# Patient Record
Sex: Female | Born: 1970 | Race: White | Hispanic: No | Marital: Married | Smoking: Never smoker
Health system: Southern US, Community
[De-identification: ages and names within clinical notes are randomized; demographics above are authoritative.]

## PROBLEM LIST (undated history)

## (undated) DIAGNOSIS — E079 Disorder of thyroid, unspecified: Secondary | ICD-10-CM

---

## 2000-05-27 ENCOUNTER — Other Ambulatory Visit: Admission: RE | Admit: 2000-05-27 | Discharge: 2000-05-27 | Payer: Self-pay | Admitting: Obstetrics and Gynecology

## 2001-06-07 ENCOUNTER — Other Ambulatory Visit: Admission: RE | Admit: 2001-06-07 | Discharge: 2001-06-07 | Payer: Self-pay | Admitting: Gynecology

## 2002-07-19 ENCOUNTER — Other Ambulatory Visit: Admission: RE | Admit: 2002-07-19 | Discharge: 2002-07-19 | Payer: Self-pay | Admitting: Gynecology

## 2003-07-16 ENCOUNTER — Other Ambulatory Visit: Admission: RE | Admit: 2003-07-16 | Discharge: 2003-07-16 | Payer: Self-pay | Admitting: Gynecology

## 2004-06-04 ENCOUNTER — Other Ambulatory Visit: Admission: RE | Admit: 2004-06-04 | Discharge: 2004-06-04 | Payer: Self-pay | Admitting: Obstetrics and Gynecology

## 2005-03-04 ENCOUNTER — Encounter: Admission: RE | Admit: 2005-03-04 | Discharge: 2005-03-04 | Payer: Self-pay | Admitting: Orthopedic Surgery

## 2005-06-07 ENCOUNTER — Other Ambulatory Visit: Admission: RE | Admit: 2005-06-07 | Discharge: 2005-06-07 | Payer: Self-pay | Admitting: Obstetrics and Gynecology

## 2006-06-14 ENCOUNTER — Other Ambulatory Visit: Admission: RE | Admit: 2006-06-14 | Discharge: 2006-06-14 | Payer: Self-pay | Admitting: Obstetrics and Gynecology

## 2007-07-20 ENCOUNTER — Other Ambulatory Visit: Admission: RE | Admit: 2007-07-20 | Discharge: 2007-07-20 | Payer: Self-pay | Admitting: Obstetrics and Gynecology

## 2008-07-23 ENCOUNTER — Other Ambulatory Visit: Admission: RE | Admit: 2008-07-23 | Discharge: 2008-07-23 | Payer: Self-pay | Admitting: Obstetrics and Gynecology

## 2009-06-30 ENCOUNTER — Other Ambulatory Visit: Admission: RE | Admit: 2009-06-30 | Discharge: 2009-06-30 | Payer: Self-pay | Admitting: Obstetrics and Gynecology

## 2014-05-21 ENCOUNTER — Other Ambulatory Visit: Payer: Self-pay

## 2014-05-21 DIAGNOSIS — Z1231 Encounter for screening mammogram for malignant neoplasm of breast: Secondary | ICD-10-CM

## 2014-05-27 ENCOUNTER — Encounter (INDEPENDENT_AMBULATORY_CARE_PROVIDER_SITE_OTHER): Payer: Self-pay

## 2014-05-27 ENCOUNTER — Ambulatory Visit
Admission: RE | Admit: 2014-05-27 | Discharge: 2014-05-27 | Disposition: A | Discharge status: Home / Self Care | Payer: 59 | Source: Ambulatory Visit

## 2014-05-27 DIAGNOSIS — Z1231 Encounter for screening mammogram for malignant neoplasm of breast: Secondary | ICD-10-CM

## 2016-05-31 ENCOUNTER — Other Ambulatory Visit: Payer: Self-pay | Admitting: Obstetrics and Gynecology

## 2016-05-31 DIAGNOSIS — Z1231 Encounter for screening mammogram for malignant neoplasm of breast: Secondary | ICD-10-CM

## 2016-07-09 ENCOUNTER — Ambulatory Visit
Admission: RE | Admit: 2016-07-09 | Discharge: 2016-07-09 | Disposition: A | Discharge status: Home / Self Care | Payer: 59 | Source: Ambulatory Visit | Attending: Obstetrics and Gynecology | Admitting: Obstetrics and Gynecology

## 2016-07-09 DIAGNOSIS — Z1231 Encounter for screening mammogram for malignant neoplasm of breast: Secondary | ICD-10-CM

## 2017-07-13 ENCOUNTER — Other Ambulatory Visit: Payer: Self-pay | Admitting: Obstetrics and Gynecology

## 2017-07-13 DIAGNOSIS — Z1231 Encounter for screening mammogram for malignant neoplasm of breast: Secondary | ICD-10-CM

## 2017-08-09 ENCOUNTER — Ambulatory Visit
Admission: RE | Admit: 2017-08-09 | Discharge: 2017-08-09 | Disposition: A | Discharge status: Home / Self Care | Payer: 59 | Source: Ambulatory Visit | Attending: Obstetrics and Gynecology | Admitting: Obstetrics and Gynecology

## 2017-08-09 DIAGNOSIS — Z1231 Encounter for screening mammogram for malignant neoplasm of breast: Secondary | ICD-10-CM

## 2017-09-10 ENCOUNTER — Other Ambulatory Visit: Payer: Self-pay

## 2017-09-10 ENCOUNTER — Emergency Department (HOSPITAL_BASED_OUTPATIENT_CLINIC_OR_DEPARTMENT_OTHER)
Admission: EM | Admit: 2017-09-10 | Discharge: 2017-09-10 | Disposition: A | Discharge status: Home / Self Care | Payer: 59 | Attending: Emergency Medicine | Admitting: Emergency Medicine

## 2017-09-10 ENCOUNTER — Encounter (HOSPITAL_BASED_OUTPATIENT_CLINIC_OR_DEPARTMENT_OTHER): Payer: Self-pay | Admitting: Emergency Medicine

## 2017-09-10 DIAGNOSIS — R21 Rash and other nonspecific skin eruption: Secondary | ICD-10-CM | POA: Insufficient documentation

## 2017-09-10 DIAGNOSIS — Z79899 Other long term (current) drug therapy: Secondary | ICD-10-CM | POA: Diagnosis not present

## 2017-09-10 DIAGNOSIS — Z7984 Long term (current) use of oral hypoglycemic drugs: Secondary | ICD-10-CM | POA: Diagnosis not present

## 2017-09-10 HISTORY — DX: Disorder of thyroid, unspecified: E07.9

## 2017-09-10 LAB — PREGNANCY, URINE: Preg Test, Ur: NEGATIVE

## 2017-09-10 MED ORDER — DOXYCYCLINE HYCLATE 100 MG PO CAPS: 100.0000 mg | ORAL_CAPSULE | Freq: Two times a day (BID) | ORAL | 0 refills | Status: AC

## 2017-09-10 NOTE — ED Triage Notes (Signed)
Small red itchy bumps over entire body. States it started on chest and arms Wednesday and woke up this morning and it was on entire body.

## 2017-09-10 NOTE — ED Provider Notes (Signed)
[facility] EMERGENCY DEPARTMENT Provider Note   CSN: [id number] Arrival date & time: 09/10/17  4098     History   Chief Complaint Chief Complaint  Patient presents with  . Rash    HPI Angela Haley is a 47 y.o. female personal history of thyroid disease who presents for evaluation of generalized rash x3 days.  Patient reports that initially, the rash started on her chest and arms.  Patient reports that she saw her primary care doctor yesterday for evaluation of rash and was told to take Benadryl and Zyrtec which she states she has been taking.  Patient reports that last night, the rash became worse and spread to her arms, back, chest, abdomen, legs.  Patient reports that the rash is pruritic but is not painful.  States that the Benadryl helps some with the symptoms but states that did not help with the rash.  Patient denies any new exposures, lotions, detergents, soaps.  She denies any new antibiotics or other new medications.  She has no known history of allergies and states she has not eaten any abnormal foods.  Patient reports that she has not known of any tick bites but states she has been out side walking.  Additionally, she does have animals.  Patient states that she was recently in [place] but had no encounters with any animals there.  Additionally, patient reports that prior to onset of symptoms, she did swim in a public pool but she states that she has done so before without any abnormalities.  Patient denies any fevers, chest pain, difficulty breathing, nausea/vomiting.  The history is provided by the patient.    Past Medical History:  Diagnosis Date  . Thyroid disease     There are no active problems to display for this patient.   History reviewed. No pertinent surgical history.   OB History   None      Home Medications    Prior to Admission medications   Medication Sig Start Date End Date Taking? Authorizing Provider  levothyroxine (SYNTHROID) 200  MCG tablet daily 04/09/15  Yes [provider]  metFORMIN (GLUCOPHAGE-XR) 500 MG 24 hr tablet TK 1 TS PO once daily 04/02/15  Yes [provider]  norethindrone-ethinyl estradiol (JUNEL FE,GILDESS FE,LOESTRIN FE) 1-20 MG-MCG tablet Take by mouth. 07/22/17  Yes [provider]  doxycycline (VIBRAMYCIN) 100 MG capsule Take 1 capsule (100 mg total) by mouth 2 (two) times daily for 10 days. 09/10/17 09/20/17  Maxwell Caul, PA-C  escitalopram (LEXAPRO) 10 MG tablet  09/05/17   [provider]  hydrochlorothiazide (HYDRODIURIL) 25 MG tablet  09/04/17   [provider]  VENTOLIN HFA 108 (90 Base) MCG/ACT inhaler INHALE 2 PUFFS EVEY 4 HOURS PRN WHEEZING 06/14/17   [provider]    Family History Family History  Problem Relation Age of Onset  . Breast cancer Neg Hx     Social History Social History   Tobacco Use  . Smoking status: Never Smoker  . Smokeless tobacco: Never Used  Substance Use Topics  . Alcohol use: Yes  . Drug use: Never     Allergies   Codeine   Review of Systems Review of Systems  Constitutional: Negative for fever.  Respiratory: Negative for cough and shortness of breath.   Cardiovascular: Negative for chest pain.  Gastrointestinal: Negative for abdominal pain, nausea and vomiting.  Skin: Positive for rash. Negative for wound.  Neurological: Negative for headaches.  All other systems reviewed and are  negative.    Physical Exam Updated Vital Signs BP 119/69 (BP Location: Right Arm)   Pulse 64   Temp 98.4 F (36.9 C) (Oral)   Resp 18   Ht 5\' 6"  (1.676 m)   Wt 119.7 kg (264 lb)   SpO2 97%   BMI 42.61 kg/m   Physical Exam  Constitutional: She appears well-developed and well-nourished.  HENT:  Head: Normocephalic and atraumatic.  Mouth/Throat: Oropharynx is clear and moist and mucous membranes are normal. No oral lesions.  Airway is patent, phonation is normal.  No oral lesions.  Eyes: Conjunctivae  and EOM are normal. Right eye exhibits no discharge. Left eye exhibits no discharge. No scleral icterus.  Pulmonary/Chest: Effort normal.  Neurological: She is alert.  Skin: Skin is warm and dry. Capillary refill takes less than 2 seconds. Rash noted. Rash is papular.  Diffusely scattered papular rash noted to arms, legs, abdomen, chest, back, face.  No rash noted on palms or soles.  No purpura or petechiae noted.  Psychiatric: She has a normal mood and affect. Her speech is normal and behavior is normal.  Nursing note and vitals reviewed.          ED Treatments / Results  Labs (all labs ordered are listed, but only abnormal results are displayed) Labs Reviewed  PREGNANCY, URINE  ROCKY MTN SPOTTED FVR ABS PNL(IGG+IGM)  B. BURGDORFI ANTIBODIES  LYME DISEASE, WESTERN BLOT    EKG None  Radiology No results found.  Procedures Procedures (including critical care time)  Medications Ordered in ED Medications - No data to display   Initial Impression / Assessment and Plan / ED Course  I have reviewed the triage vital signs and the nursing notes.  Pertinent labs & imaging results that were available during my care of the patient were reviewed by me and considered in my medical decision making (see chart for details).     47 year old female who presents for evaluation of rash x2 days.  Reports that initially was on her arms and chest.  States it spread last night.  No associated fevers, chills, vomiting, dumping, chest pain, difficulty breathing.  No new exposures, soaps, detergents.  Patient states that she has not noticed any tick bite but states she has been outside.  She also has animals.  Patient is afebrile, non-toxic appearing, sitting comfortably on examination table. Vital signs reviewed and stable.  On exam, patient has a diffuse papular rash noted throughout her entire body but spares the palms and soles.  No evidence of oral lesions. Discussed patient with Dr. Rush Landmark.   While the rash spares the palms and soles of feet, it is concerning given its papular nature for [facility] spotted fever.  At this time, the rash does not appear to be syphilis, SJS, TENS.  Additionally, does not appear to be a contact dermatitis, eczema.  We discussed at length with patient regarding concerns for unknown rash, particularly the risks of leaving recommended spotted fever untreated.  At this time,  given concerning generalized rash, will plan to go ahead and prophylactically treat for RMSF.  Patient would like to have the titers drawn.   Urine pregnancy negative.  Titers drawn.  We will plan to start patient on doxycycline.  Encourage primary care follow-up in the next 2 to 4 days for further evaluation.  Instructed patient to return the emergency department for any worsening or concerning symptoms. Patient had ample opportunity for questions and discussion. All patient's questions were answered with full  understanding. Strict return precautions discussed. Patient expresses understanding and agreement to plan.    Final Clinical Impressions(s) / ED Diagnoses   Final diagnoses:  Rash    ED Discharge Orders        Ordered    doxycycline (VIBRAMYCIN) 100 MG capsule  2 times daily     09/10/17 1145       Rosana HoesLayden, [place] A, PA-C 09/10/17 1211    Tegeler, Canary Brimhristopher J, MD 09/10/17 [phone]

## 2017-09-10 NOTE — Discharge Instructions (Signed)
As we discussed, today [facility].  The titers that we do do not come back for several days.  You can log it online to see if this titers have returned.  Take antibiotic as directed.  As we discussed, this antibiotic can cause a photosensitivity rash.  Please be aware that if you are out in the sun, you need to be covered.  Follow-up with your primary care doctor in the next 2 to 4 days for further evaluation.  Return the emergency department immediately for any worsening rash, rash or lesions in your mouth, fevers, vomiting, difficulty breathing, chest pain or any other worsening or concerning symptoms.

## 2017-09-12 LAB — B. BURGDORFI ANTIBODIES: B burgdorferi Ab IgG+IgM: <0.91 {ISR} (ref 0.00–0.90)

## 2017-09-13 LAB — LYME DISEASE, WESTERN BLOT
IGG P28 AB.: ABSENT
IGG P30 AB.: ABSENT
IGG P45 AB.: ABSENT
IGG P58 AB.: ABSENT
IGM P23 AB.: ABSENT
IgG P18 Ab.: ABSENT
IgG P23 Ab.: ABSENT
IgG P39 Ab.: ABSENT
IgG P66 Ab.: ABSENT
IgG P93 Ab.: ABSENT
IgM P39 Ab.: ABSENT
IgM P41 Ab.: ABSENT
LYME IGM WB: NEGATIVE
Lyme IgG Wb: NEGATIVE

## 2017-09-13 LAB — ROCKY MTN SPOTTED FVR ABS PNL(IGG+IGM)
RMSF IGG: NEGATIVE
RMSF IgM: 0.18 index (ref 0.00–0.89)

## 2018-01-26 ENCOUNTER — Other Ambulatory Visit: Payer: Self-pay | Admitting: Oncology

## 2018-11-01 ENCOUNTER — Other Ambulatory Visit: Payer: Self-pay | Admitting: Emergency Medicine

## 2018-11-01 DIAGNOSIS — Z1231 Encounter for screening mammogram for malignant neoplasm of breast: Secondary | ICD-10-CM

## 2018-12-14 ENCOUNTER — Other Ambulatory Visit: Payer: Self-pay

## 2018-12-14 ENCOUNTER — Ambulatory Visit
Admission: RE | Admit: 2018-12-14 | Discharge: 2018-12-14 | Disposition: A | Discharge status: Home / Self Care | Payer: 59 | Source: Ambulatory Visit | Attending: Emergency Medicine | Admitting: Emergency Medicine

## 2018-12-14 DIAGNOSIS — Z1231 Encounter for screening mammogram for malignant neoplasm of breast: Secondary | ICD-10-CM

## 2019-02-28 ENCOUNTER — Other Ambulatory Visit: Payer: Self-pay | Admitting: Surgical Oncology

## 2019-03-14 ENCOUNTER — Ambulatory Visit
Admission: RE | Admit: 2019-03-14 | Discharge: 2019-03-14 | Disposition: A | Discharge status: Home / Self Care | Payer: 59 | Source: Ambulatory Visit | Attending: Surgical Oncology | Admitting: Surgical Oncology

## 2019-06-01 ENCOUNTER — Ambulatory Visit: Payer: 59 | Attending: Internal Medicine

## 2019-06-01 DIAGNOSIS — Z23 Encounter for immunization: Secondary | ICD-10-CM

## 2019-06-01 NOTE — Progress Notes (Signed)
   Covid-19 Vaccination Clinic  Name:  Angela Haley    MRN: [id number] DOB: [birth year]  06/01/2019  [name] was observed post Covid-19 immunization for 15 minutes without incident. She was provided with Vaccine Information Sheet and instruction to access the V-Safe system.   [name] was instructed to call 911 with any severe reactions post vaccine: Marland Kitchen Difficulty breathing  . Swelling of face and throat  . A fast heartbeat  . A bad rash all over body  . Dizziness and weakness   Immunizations Administered    Name Date Dose VIS Date Route   Pfizer COVID-19 Vaccine 06/01/2019  4:20 PM 0.3 mL 02/23/2019 Intramuscular   Manufacturer: [employer], [employer]   Lot: [id number]   NDC: [address]-9

## 2019-06-27 ENCOUNTER — Ambulatory Visit: Payer: 59 | Attending: Internal Medicine

## 2019-06-27 DIAGNOSIS — Z23 Encounter for immunization: Secondary | ICD-10-CM

## 2019-06-27 NOTE — Progress Notes (Signed)
   Covid-19 Vaccination Clinic  Name:  Angela Haley    MRN: [id number] DOB: [birth year]  06/27/2019  Ms. Angela Haley was observed post Covid-19 immunization for 15 minutes without incident. She was provided with Vaccine Information Sheet and instruction to access the V-Safe system.   Ms. Angela Haley was instructed to call 911 with any severe reactions post vaccine: Marland Kitchen Difficulty breathing  . Swelling of face and throat  . A fast heartbeat  . A bad rash all over body  . Dizziness and weakness   Immunizations Administered    Name Date Dose VIS Date Route   Pfizer COVID-19 Vaccine 06/27/2019  4:17 PM 0.3 mL 02/23/2019 Intramuscular   Manufacturer: [employer], [employer]   Lot: [id number]   NDC: [address]-8

## 2020-01-30 ENCOUNTER — Other Ambulatory Visit: Payer: Self-pay | Admitting: Emergency Medicine

## 2020-01-30 DIAGNOSIS — N6320 Unspecified lump in the left breast, unspecified quadrant: Secondary | ICD-10-CM

## 2020-02-14 ENCOUNTER — Ambulatory Visit
Admission: RE | Admit: 2020-02-14 | Discharge: 2020-02-14 | Disposition: A | Discharge status: Home / Self Care | Payer: 59 | Source: Ambulatory Visit | Attending: Emergency Medicine | Admitting: Emergency Medicine

## 2020-02-14 ENCOUNTER — Other Ambulatory Visit: Payer: Self-pay

## 2020-02-14 DIAGNOSIS — N6320 Unspecified lump in the left breast, unspecified quadrant: Secondary | ICD-10-CM

## 2020-03-03 ENCOUNTER — Other Ambulatory Visit: Payer: 59

## 2021-01-28 ENCOUNTER — Other Ambulatory Visit: Payer: Self-pay | Admitting: Emergency Medicine

## 2021-01-28 DIAGNOSIS — Z1231 Encounter for screening mammogram for malignant neoplasm of breast: Secondary | ICD-10-CM

## 2021-02-17 ENCOUNTER — Other Ambulatory Visit: Payer: Self-pay | Admitting: Obstetrics & Gynecology

## 2021-02-17 DIAGNOSIS — Z1231 Encounter for screening mammogram for malignant neoplasm of breast: Secondary | ICD-10-CM

## 2021-03-03 ENCOUNTER — Ambulatory Visit: Payer: 59

## 2021-04-08 ENCOUNTER — Ambulatory Visit: Payer: 59

## 2021-04-29 ENCOUNTER — Ambulatory Visit
Admission: RE | Admit: 2021-04-29 | Discharge: 2021-04-29 | Disposition: A | Discharge status: Home / Self Care | Payer: 59 | Source: Ambulatory Visit | Attending: Obstetrics & Gynecology | Admitting: Obstetrics & Gynecology

## 2021-04-29 DIAGNOSIS — Z1231 Encounter for screening mammogram for malignant neoplasm of breast: Secondary | ICD-10-CM

## 2023-01-12 ENCOUNTER — Other Ambulatory Visit: Payer: Self-pay | Admitting: Obstetrics & Gynecology

## 2023-01-12 DIAGNOSIS — Z1231 Encounter for screening mammogram for malignant neoplasm of breast: Secondary | ICD-10-CM

## 2023-02-16 ENCOUNTER — Ambulatory Visit
Admission: RE | Admit: 2023-02-16 | Discharge: 2023-02-16 | Disposition: A | Discharge status: Home / Self Care | Payer: No Typology Code available for payment source | Source: Ambulatory Visit | Attending: Obstetrics & Gynecology | Admitting: Obstetrics & Gynecology

## 2023-02-16 DIAGNOSIS — Z1231 Encounter for screening mammogram for malignant neoplasm of breast: Secondary | ICD-10-CM

## 2023-08-29 ENCOUNTER — Ambulatory Visit
Admission: RE | Admit: 2023-08-29 | Discharge: 2023-08-29 | Disposition: A | Discharge status: Home / Self Care | Source: Ambulatory Visit | Attending: Family Medicine | Admitting: Family Medicine

## 2023-08-29 VITALS — BP 110/73 | HR 80 | Temp 98.3°F | Resp 16

## 2023-08-29 DIAGNOSIS — B349 Viral infection, unspecified: Secondary | ICD-10-CM

## 2023-08-29 LAB — POCT RAPID STREP A (OFFICE): Rapid Strep A Screen: NEGATIVE

## 2023-08-29 MED ORDER — AZELASTINE HCL 0.1 % NA SOLN: 1.0000 | Freq: Two times a day (BID) | NASAL | 1 refills | Status: AC

## 2023-08-29 MED ORDER — FEXOFENADINE HCL 180 MG PO TABS: 180.0000 mg | ORAL_TABLET | Freq: Every day | ORAL | 1 refills | Status: AC

## 2023-08-29 NOTE — Discharge Instructions (Signed)
  1. Acute viral syndrome (Primary) - POCT rapid strep A completed in UC is negative for strep pharyngitis - azelastine (ASTELIN) 0.1 % nasal spray; Place 1 spray into both nostrils 2 (two) times daily. Use in each nostril as directed  Dispense: 30 mL; Refill: 1 - fexofenadine (ALLEGRA) 180 MG tablet; Take 1 tablet (180 mg total) by mouth daily.  Dispense: 60 tablet; Refill: 1 Continue to monitor symptoms for any changes severity if there is any escalation of current symptoms or development of new symptoms follow-up for further evaluation treatment.

## 2023-08-29 NOTE — ED Triage Notes (Signed)
 Pt c/o sore throat that started Saturday. Next day developed cough and congestion. Sore throat has improved some since Saturday

## 2023-08-29 NOTE — ED Provider Notes (Signed)
 [facility]  Note:  This document was prepared using Dragon voice recognition software and may include unintentional dictation errors.  MRN: [id number] DOB: [birth year]  Subjective:   Angela Haley is a 53 y.o. female presenting for moderately sore throat starting on Saturday.  Patient states that she took Tylenol Mucinex when she woke up on Sunday sore throat was improved however she had developed nasal congestion and a cough.  Patient states today she feels worse than she did yesterday.  Patient denies any known exposure anyone sick with COVID, flu, strep.  Patient reports that she did a COVID test at home which was negative.  Patient has not taken any other medication with Mucinex and Tylenol for symptoms.  No shortness of breath, chest pain, weakness, dizziness.  No current facility-administered medications for this encounter.  Current Outpatient Medications:    azelastine (ASTELIN) 0.1 % nasal spray, Place 1 spray into both nostrils 2 (two) times daily. Use in each nostril as directed, Disp: 30 mL, Rfl: 1   fexofenadine (ALLEGRA) 180 MG tablet, Take 1 tablet (180 mg total) by mouth daily., Disp: 60 tablet, Rfl: 1   Phentermine-Topiramate (QSYMIA PO), Take by mouth., Disp: , Rfl:    escitalopram (LEXAPRO) 10 MG tablet, , Disp: , Rfl: 5   hydrochlorothiazide (HYDRODIURIL) 25 MG tablet, , Disp: , Rfl:    levothyroxine (SYNTHROID) 200 MCG tablet, 150mg  M,W, F, Sun 125mg  T,Th, Sat, Disp: , Rfl:    metFORMIN (GLUCOPHAGE-XR) 500 MG 24 hr tablet, TK 1 TS PO once daily (Patient not taking: Reported on 08/29/2023), Disp: , Rfl:    norethindrone-ethinyl estradiol (JUNEL FE,GILDESS FE,LOESTRIN FE) 1-20 MG-MCG tablet, Take by mouth., Disp: , Rfl:    VENTOLIN HFA 108 (90 Base) MCG/ACT inhaler, INHALE 2 PUFFS EVEY 4 HOURS PRN WHEEZING, Disp: , Rfl: 0   Allergies  Allergen Reactions   Codeine     Past Medical History:  Diagnosis Date   Thyroid disease      History  reviewed. No pertinent surgical history.  Family History  Problem Relation Age of Onset   Breast cancer Neg Hx     Social History   Tobacco Use   Smoking status: Never   Smokeless tobacco: Never  Substance Use Topics   Alcohol use: Yes   Drug use: Never    ROS Refer to HPI for ROS details.  Objective:    Vitals: BP 110/73 (BP Location: Right Arm)   Pulse 80   Temp 98.3 F (36.8 C) (Oral)   Resp 16   SpO2 98%   Physical Exam Vitals and nursing note reviewed.  Constitutional:      General: She is not in acute distress.    Appearance: She is well-developed. She is not ill-appearing or toxic-appearing.  HENT:     Head: Normocephalic and atraumatic.     Mouth/Throat:     Mouth: Mucous membranes are moist.   Cardiovascular:     Rate and Rhythm: Normal rate.  Pulmonary:     Effort: Pulmonary effort is normal. No respiratory distress.   Musculoskeletal:        General: Normal range of motion.   Skin:    General: Skin is warm and dry.   Neurological:     General: No focal deficit present.     Mental Status: She is alert and oriented to person, place, and time.   Psychiatric:        Mood and Affect: Mood normal.  Behavior: Behavior normal.     Procedures  Results for orders placed or performed during the hospital encounter of 08/29/23 (from the past 24 hours)  POCT rapid strep A     Status: None   Collection Time: 08/29/23  5:41 PM  Result Value Ref Range   Rapid Strep A Screen Negative Negative    Assessment and Plan :     Discharge Instructions       1. Acute viral syndrome (Primary) - POCT rapid strep A completed in UC is negative for strep pharyngitis - azelastine (ASTELIN) 0.1 % nasal spray; Place 1 spray into both nostrils 2 (two) times daily. Use in each nostril as directed  Dispense: 30 mL; Refill: 1 - fexofenadine (ALLEGRA) 180 MG tablet; Take 1 tablet (180 mg total) by mouth daily.  Dispense: 60 tablet; Refill: 1 Continue to  monitor symptoms for any changes severity if there is any escalation of current symptoms or development of new symptoms follow-up for further evaluation treatment.      Jackey Housey B Chiante Peden   Tyrice Hewitt, [place], [place] 08/29/23 1825

## 2023-09-01 ENCOUNTER — Other Ambulatory Visit: Payer: Self-pay

## 2023-09-01 ENCOUNTER — Ambulatory Visit
Admission: RE | Admit: 2023-09-01 | Discharge: 2023-09-01 | Disposition: A | Discharge status: Home / Self Care | Source: Ambulatory Visit | Attending: Physician Assistant

## 2023-09-01 VITALS — BP 129/80 | HR 99 | Temp 98.1°F | Resp 18 | Ht 65.0 in | Wt 247.0 lb

## 2023-09-01 DIAGNOSIS — R0989 Other specified symptoms and signs involving the circulatory and respiratory systems: Secondary | ICD-10-CM | POA: Diagnosis not present

## 2023-09-01 DIAGNOSIS — H109 Unspecified conjunctivitis: Secondary | ICD-10-CM | POA: Diagnosis not present

## 2023-09-01 MED ORDER — CIPROFLOXACIN HCL 0.3 % OP SOLN: 1.0000 [drp] | OPHTHALMIC | 0 refills | Status: AC

## 2023-09-01 NOTE — Discharge Instructions (Addendum)
 Based on your symptoms I believe that you have bacterial conjunctivitis  I have sent in a script for Ciprofloxacin eye drops to be applied as directed for 7 days  You can use sterile eye flushes and lubricating eye drops to assist with eye irritation and further resolution You can also use a warm compress over the eye to assist with swelling and matting - especially in the morning  If you have used makeup or mascara on that eye I recommend discarding it as this can cause recurrent infection. Thoroughly wash any makeup brushes and avoid using makeup while recovering from the infection.  If you notice the following please return to the office: lack of improvement, eyelid swelling, increased eye irritation If you notice the following please go to the ED: eye pressure causing displacement of the eye, vision changes, increased eye pain or foreign body sensation, fever

## 2023-09-01 NOTE — ED Triage Notes (Signed)
 Pt presents with complaints of right eye drainage that began last night 6/18. Denies itching. States the drainage is yellow. Sore throat, cough, and sinus pressure also reported. Pt was seen on 6/16 for similar symptoms, states they are not improving. Currently rates her throat pain a 5/10, pain worsens at nighttime.

## 2023-09-01 NOTE — ED Provider Notes (Signed)
 [facility]    CSN: [id number] Arrival date & time: 09/01/23  0919      History   Chief Complaint Chief Complaint  Patient presents with   Eye Problem   Sore Throat   Sinus Pressure    HPI Angela Haley is a 53 y.o. female.   HPI Pt reports she developed sore throat Sat evening. She then had productive cough on Sun and was seen in this UC on Monday for symptoms  She started allegra as directed from apt on Mon She reports that she still has productive coughing, sore throat, rhinorrhea, fatigue  She denies fever  She reports that she did have some yellow crusting from her eyes and they were matted shut yesterday AM  She reports coughing is usually more productive in the AM with warm shower. She reports symptoms seem more calm during the day but she still has sinus pressure and sore throat   She uses contact lens in left eye but right eye was the one with crusting and red  She denies itching or eye pain    Past Medical History:  Diagnosis Date   Thyroid disease     There are no active problems to display for this patient.   History reviewed. No pertinent surgical history.  OB History   No obstetric history on file.      Home Medications    Prior to Admission medications   Medication Sig Start Date End Date Taking? Authorizing Provider  ciprofloxacin (CILOXAN) 0.3 % ophthalmic solution Place 1 drop into the right eye every 2 (two) hours for 7 days. Administer 1 drop, every 2 hours, while awake, for 2 days. Then 1 drop, every 4 hours, while awake, for the next 5 days. 09/01/23 09/08/23 Yes Lindaann Gradilla E, PA-C  azelastine (ASTELIN) 0.1 % nasal spray Place 1 spray into both nostrils 2 (two) times daily. Use in each nostril as directed 08/29/23   Reddick, Johnathan B, NP  escitalopram (LEXAPRO) 10 MG tablet  09/05/17   [provider]  fexofenadine (ALLEGRA) 180 MG tablet Take 1 tablet (180 mg total) by mouth daily. 08/29/23   Reddick,  Johnathan B, NP  hydrochlorothiazide (HYDRODIURIL) 25 MG tablet  09/04/17   [provider]  levothyroxine (SYNTHROID) 200 MCG tablet 150mg  M,W, F, Sun 125mg  T,Th, Sat 04/09/15   [provider]  metFORMIN (GLUCOPHAGE-XR) 500 MG 24 hr tablet TK 1 TS PO once daily Patient not taking: Reported on 08/29/2023 04/02/15   [provider]  norethindrone-ethinyl estradiol (JUNEL FE,GILDESS FE,LOESTRIN FE) 1-20 MG-MCG tablet Take by mouth. 07/22/17   [provider]  Phentermine-Topiramate (QSYMIA PO) Take by mouth.    [provider]  VENTOLIN HFA 108 (90 Base) MCG/ACT inhaler INHALE 2 PUFFS EVEY 4 HOURS PRN WHEEZING 06/14/17   [provider]    Family History Family History  Problem Relation Age of Onset   Breast cancer Neg Hx     Social History Social History   Tobacco Use   Smoking status: Never   Smokeless tobacco: Never  Vaping Use   Vaping status: Never Used  Substance Use Topics   Alcohol use: Yes   Drug use: Never     Allergies   Codeine   Review of Systems Review of Systems  Constitutional:  Positive for fatigue and fever. Negative for chills.  HENT:  Positive for congestion, ear pain, postnasal drip (a little bit at night), rhinorrhea, sinus pressure and sore throat.  Eyes:  Positive for discharge and redness. Negative for photophobia, pain and visual disturbance.  Respiratory:  Positive for cough. Negative for shortness of breath and wheezing.   Gastrointestinal:  Negative for diarrhea, nausea and vomiting.  Musculoskeletal:  Negative for myalgias.     Physical Exam Triage Vital Signs ED Triage Vitals  Encounter Vitals Group     BP 09/01/23 0923 129/80     Girls Systolic BP Percentile --      Girls Diastolic BP Percentile --      Boys Systolic BP Percentile --      Boys Diastolic BP Percentile --      Pulse Rate 09/01/23 0923 99     Resp 09/01/23 0923 18     Temp 09/01/23 0923 98.1 F (36.7 C)     Temp  Source 09/01/23 0923 Oral     SpO2 09/01/23 0923 98 %     Weight 09/01/23 0923 247 lb (112 kg)     Height 09/01/23 0923 5' 5 (1.651 m)     Head Circumference --      Peak Flow --      Pain Score 09/01/23 0937 5     Pain Loc --      Pain Education --      Exclude from Growth Chart --    No data found.  Updated Vital Signs BP 129/80 (BP Location: Right Arm)   Pulse 99   Temp 98.1 F (36.7 C) (Oral)   Resp 18   Ht 5' 5 (1.651 m)   Wt 247 lb (112 kg)   SpO2 98%   BMI 41.10 kg/m   Visual Acuity Right Eye Distance:   Left Eye Distance:   Bilateral Distance:    Right Eye Near:   Left Eye Near:    Bilateral Near:     Physical Exam Vitals reviewed.  Constitutional:      General: She is awake. She is not in acute distress.    Appearance: Normal appearance. She is well-developed and well-groomed. She is not ill-appearing or toxic-appearing.  HENT:     Head: Normocephalic and atraumatic.     Right Ear: Hearing, tympanic membrane and ear canal normal.     Left Ear: Hearing and ear canal normal. A middle ear effusion is present.     Mouth/Throat:     Lips: Pink.     Mouth: Mucous membranes are moist.     Pharynx: Oropharynx is clear. Uvula midline. No pharyngeal swelling, oropharyngeal exudate, posterior oropharyngeal erythema, uvula swelling or postnasal drip.   Eyes:     General: Lids are normal. Gaze aligned appropriately.     Extraocular Movements: Extraocular movements intact.     Conjunctiva/sclera:     Right eye: Right conjunctiva is injected. Exudate present. No chemosis or hemorrhage.    Left eye: Left conjunctiva is not injected. No chemosis, exudate or hemorrhage.    Pupils: Pupils are equal, round, and reactive to light.    Cardiovascular:     Rate and Rhythm: Normal rate and regular rhythm.     Pulses: Normal pulses.          Radial pulses are 2+ on the right side and 2+ on the left side.     Heart sounds: Normal heart sounds. No murmur heard.    No  friction rub. No gallop.  Pulmonary:     Effort: Pulmonary effort is normal.     Breath sounds: Normal breath sounds. No decreased air movement.  No decreased breath sounds, wheezing, rhonchi or rales.   Musculoskeletal:     Cervical back: Normal range of motion and neck supple.  Lymphadenopathy:     Head:     Right side of head: No submental, submandibular or preauricular adenopathy.     Left side of head: No submental, submandibular or preauricular adenopathy.     Cervical:     Right cervical: No superficial cervical adenopathy.    Left cervical: No superficial cervical adenopathy.     Upper Body:     Right upper body: No supraclavicular adenopathy.     Left upper body: No supraclavicular adenopathy.   Neurological:     Mental Status: She is alert.   Psychiatric:        Mood and Affect: Mood normal.        Behavior: Behavior normal. Behavior is cooperative.      UC Treatments / Results  Labs (all labs ordered are listed, but only abnormal results are displayed) Labs Reviewed - No data to display  EKG   Radiology No results found.  Procedures Procedures (including critical care time)  Medications Ordered in UC Medications - No data to display  Initial Impression / Assessment and Plan / UC Course  I have reviewed the triage vital signs and the nursing notes.  Pertinent labs & imaging results that were available during my care of the patient were reviewed by me and considered in my medical decision making (see chart for details).      Final Clinical Impressions(s) / UC Diagnoses   Final diagnoses:  Symptoms of upper respiratory infection (URI)  Bacterial conjunctivitis   Visit with patient indicates symptoms comprised of sore throat, cough, and sinus pressure that has been ongoing for about 5 days congruent with acute URI that is likely viral in nature  Pt was seen here on 08/29/23 for similar concerns  and instructed to try allegra and azelastine but this has  not provided significant relief  Patient is outside therapeutic window for antivirals- no flu or COVID testing performed today.  Due to nature and duration of symptoms recommended treatment regimen is symptomatic relief and follow up if needed Discussed with patient the various viral and bacterial etiologies of current illness and appropriate course of treatment Discussed OTC medication options for multisymptom relief such as Dayquil/Nyquil, Theraflu, AlkaSeltzer, etc. Discussed return precautions if symptoms are not improving or worsen over next 5-7 days.   Patient also presents with concerns of right eye redness and drainage that started yesterday.  She reports that her eye was matted shut this morning.  Patient does use a contact in the left eye but not the right.  Patient presentation appears consistent with bacterial conjunctivitis.  Will send ciprofloxacin ophthalmic drops for management given contact lens use.  ED and return precautions reviewed and provided in after visit summary.  Follow-up as needed.     Discharge Instructions      Based on your symptoms I believe that you have bacterial conjunctivitis  I have sent in a script for Ciprofloxacin eye drops to be applied as directed for 7 days  You can use sterile eye flushes and lubricating eye drops to assist with eye irritation and further resolution You can also use a warm compress over the eye to assist with swelling and matting - especially in the morning  If you have used makeup or mascara on that eye I recommend discarding it as this can cause recurrent infection. Thoroughly wash any  makeup brushes and avoid using makeup while recovering from the infection.  If you notice the following please return to the office: lack of improvement, eyelid swelling, increased eye irritation If you notice the following please go to the ED: eye pressure causing displacement of the eye, vision changes, increased eye pain or foreign body sensation,  fever      ED Prescriptions     Medication Sig Dispense Auth. Provider   ciprofloxacin (CILOXAN) 0.3 % ophthalmic solution Place 1 drop into the right eye every 2 (two) hours for 7 days. Administer 1 drop, every 2 hours, while awake, for 2 days. Then 1 drop, every 4 hours, while awake, for the next 5 days. 5 mL Clarnce Homan E, PA-C      PDMP not reviewed this encounter.   Jerona Mooring, PA-C 09/01/23 1513

## 2023-09-11 IMAGING — MG MM DIGITAL SCREENING BILAT W/ TOMO AND CAD
8 series · 8 of 24 positions shown · non-contrast
Comparison: Previous exam(s).

CLINICAL DATA: Screening.

EXAM:
DIGITAL SCREENING BILATERAL MAMMOGRAM WITH TOMOSYNTHESIS AND CAD
TECHNIQUE: Bilateral screening digital craniocaudal and mediolateral oblique
mammograms were obtained. Bilateral screening digital breast
tomosynthesis was performed. The images were evaluated with
computer-aided detection.

[L CC synth-2D]
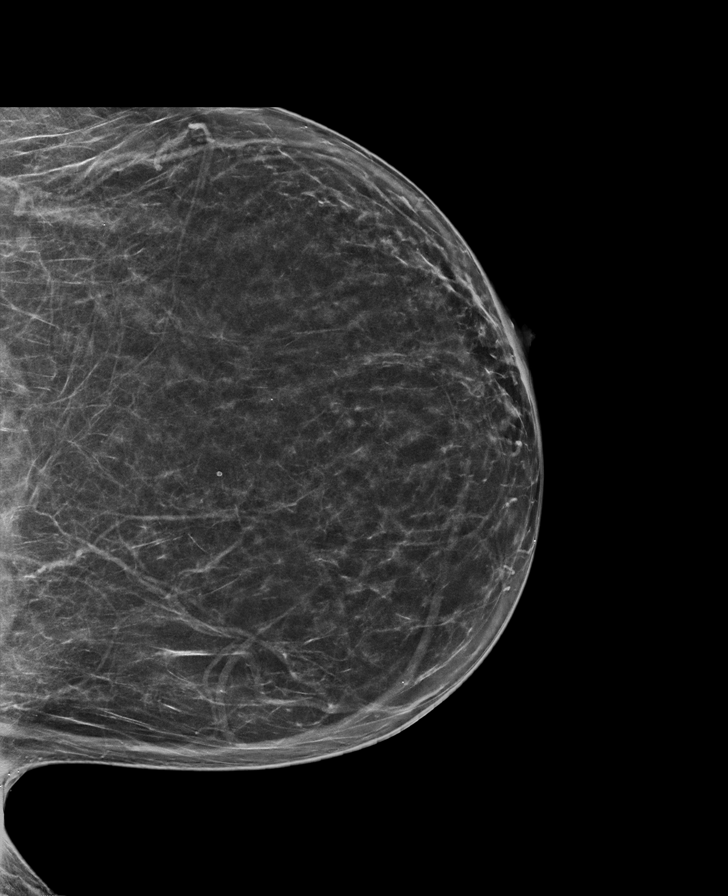

[L MLO synth-2D]
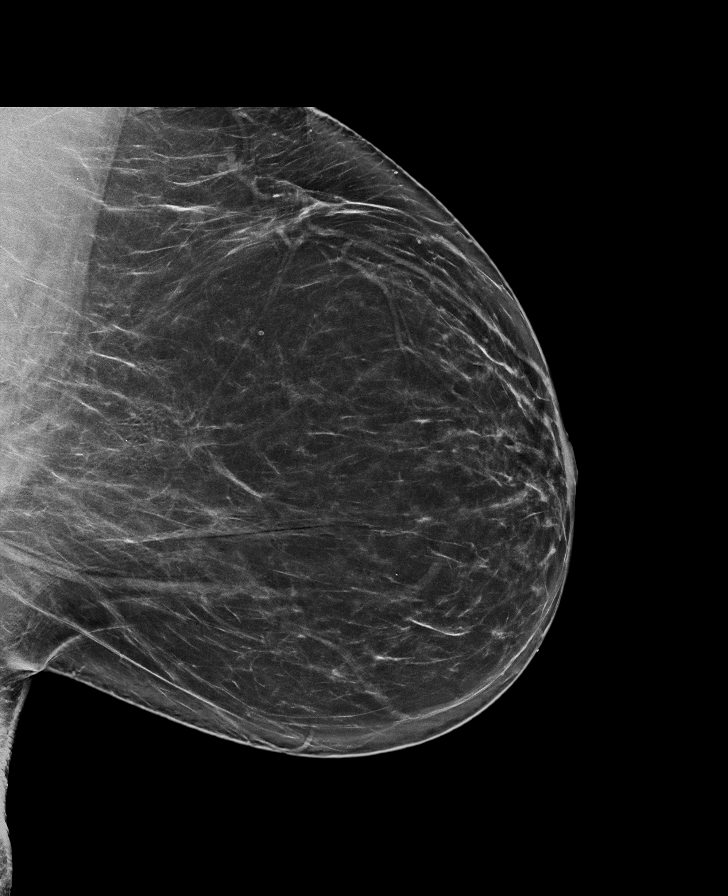

[R CC synth-2D]
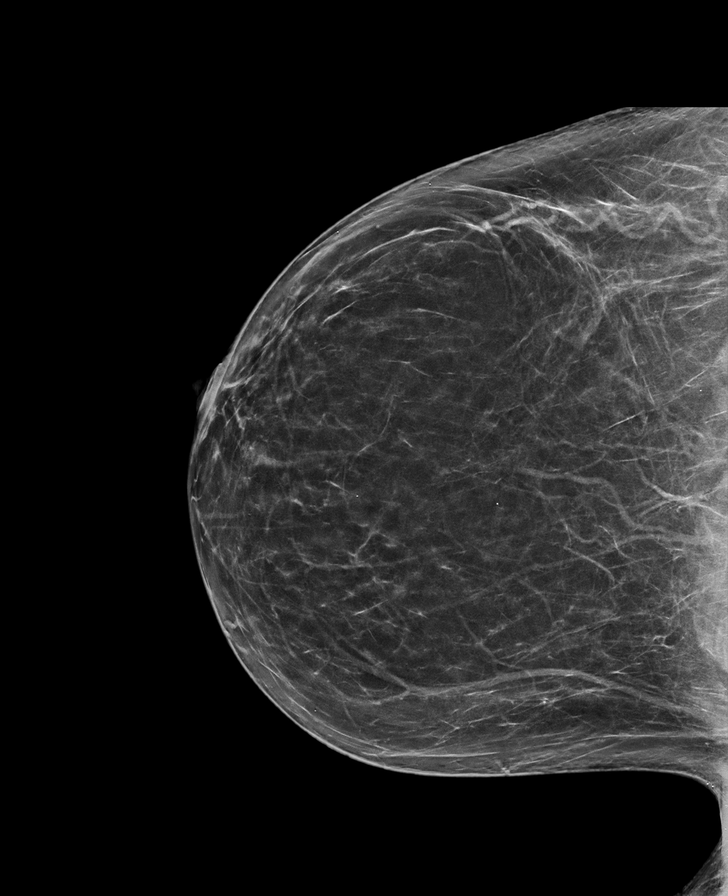

[R MLO synth-2D]
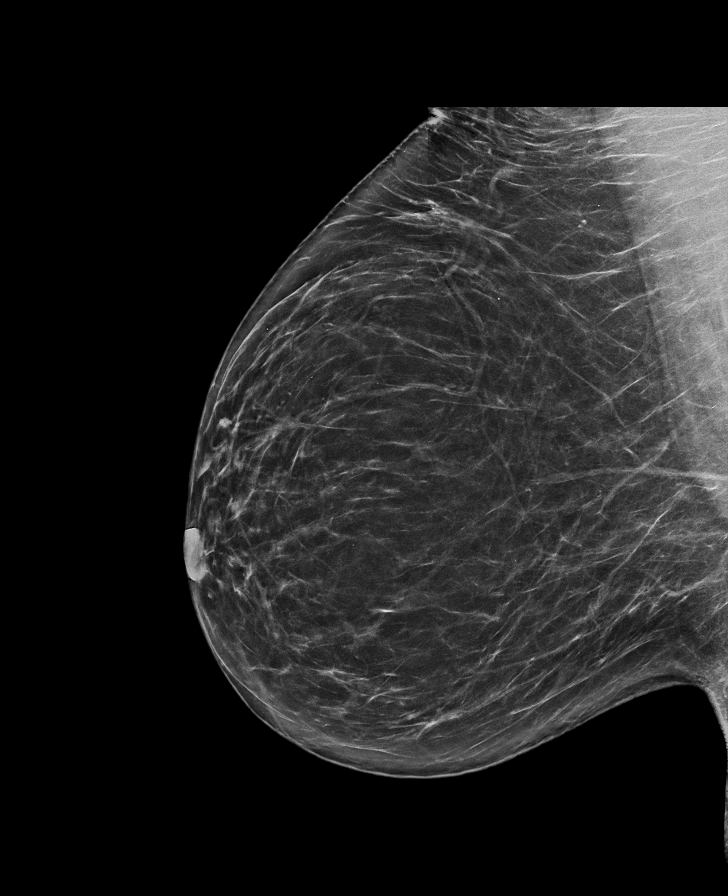

[R CC tomo · tomo slice 37/72.0]
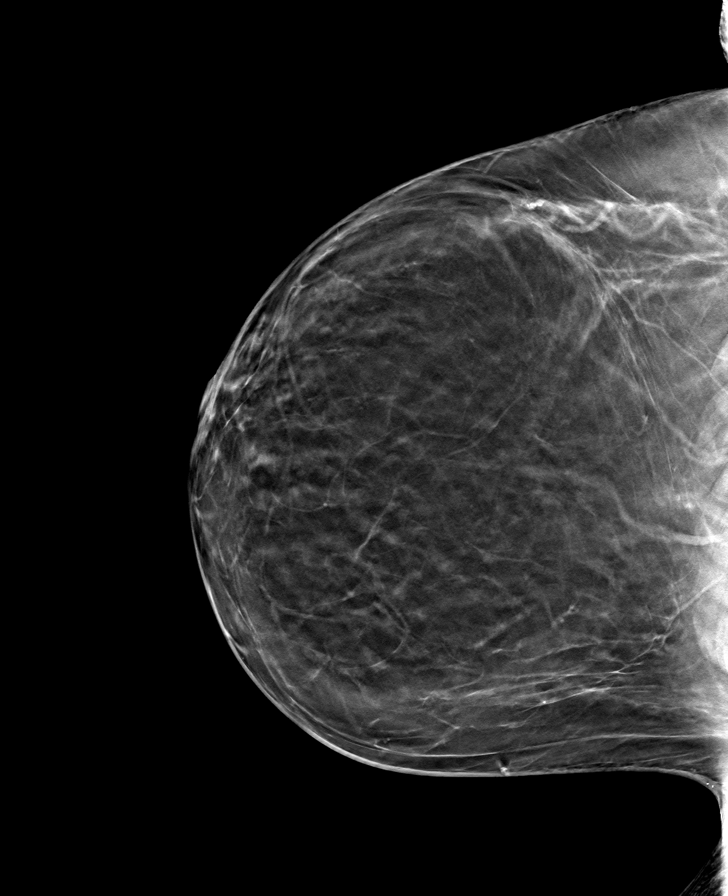

[L MLO tomo · tomo slice 43/84.0]
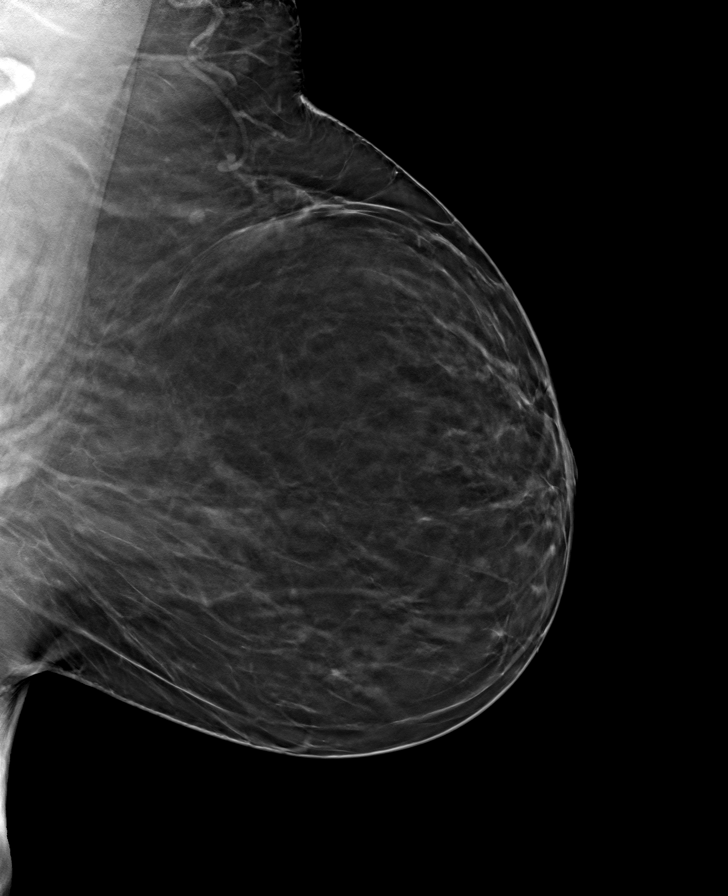

[R MLO tomo · tomo slice 40/79.0]
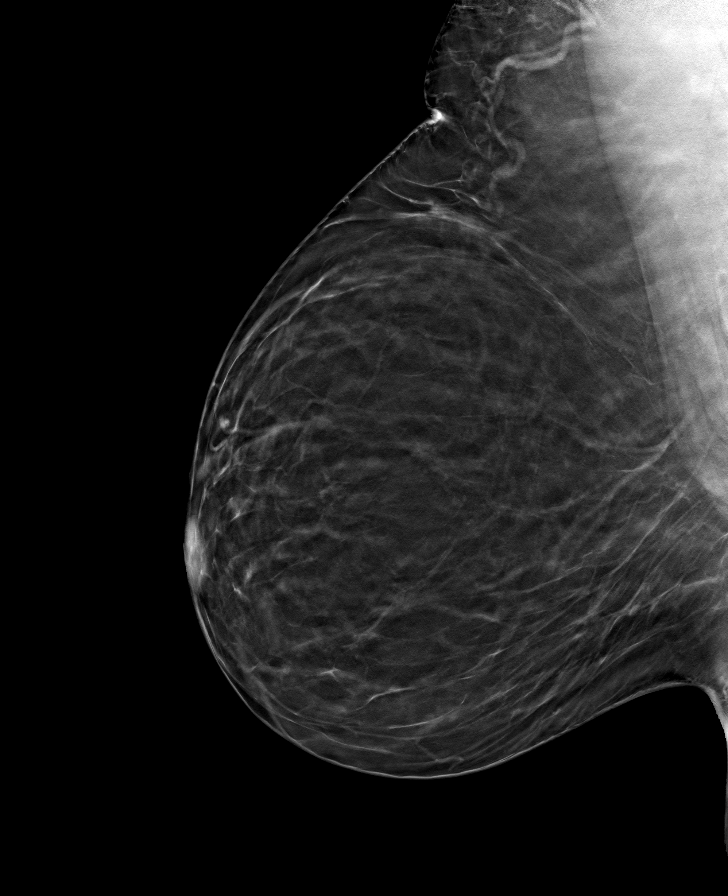

[L CC tomo · tomo slice 35/70.0]
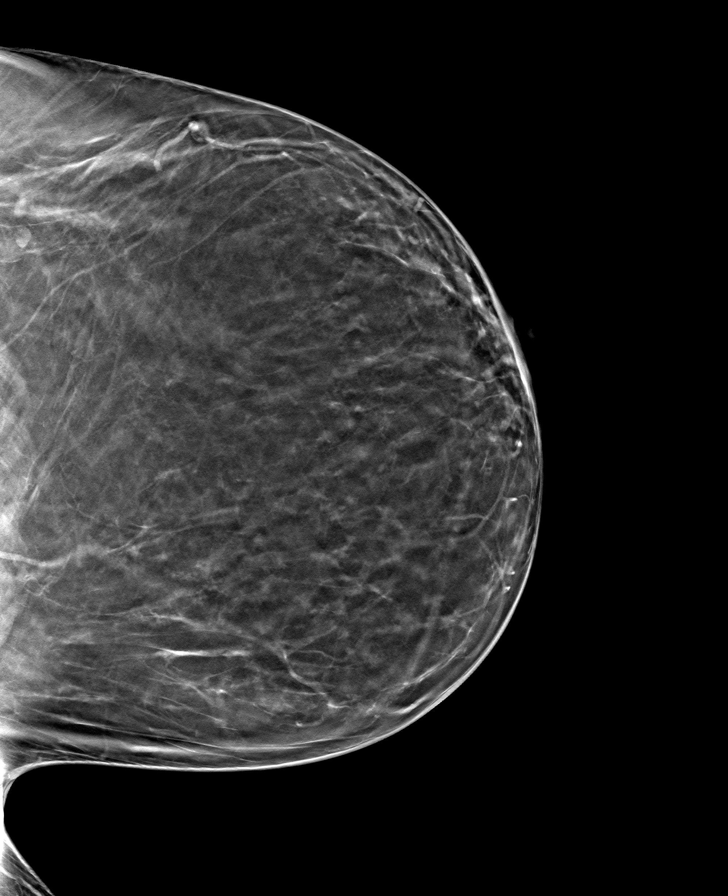

[8 of 24 positions shown; findings below may reference images not displayed]

ACR Breast Density Category b: There are scattered areas of
fibroglandular density.
FINDINGS: There are no findings suspicious for malignancy.
IMPRESSION: No mammographic evidence of malignancy. A result letter of this
screening mammogram will be mailed directly to the patient.

RECOMMENDATION:
Screening mammogram in one year. (Code:51-O-LD2)

BI-RADS CATEGORY  1: Negative.

## 2024-01-13 ENCOUNTER — Other Ambulatory Visit: Payer: Self-pay | Admitting: Medical Genetics

## 2024-01-18 ENCOUNTER — Other Ambulatory Visit: Payer: Self-pay | Admitting: Obstetrics & Gynecology

## 2024-01-18 DIAGNOSIS — Z1231 Encounter for screening mammogram for malignant neoplasm of breast: Secondary | ICD-10-CM

## 2024-02-16 ENCOUNTER — Other Ambulatory Visit

## 2024-02-16 DIAGNOSIS — Z006 Encounter for examination for normal comparison and control in clinical research program: Secondary | ICD-10-CM

## 2024-02-20 ENCOUNTER — Ambulatory Visit
Admission: RE | Admit: 2024-02-20 | Discharge: 2024-02-20 | Disposition: A | Source: Ambulatory Visit | Attending: Obstetrics & Gynecology | Admitting: Obstetrics & Gynecology

## 2024-02-20 DIAGNOSIS — Z1231 Encounter for screening mammogram for malignant neoplasm of breast: Secondary | ICD-10-CM

## 2024-03-02 LAB — GENECONNECT MOLECULAR SCREEN: Genetic Analysis Overall Interpretation: NEGATIVE
